# Patient Record
Sex: Female | Born: 1940 | Race: White | Hispanic: No
Health system: Southern US, Community
[De-identification: ages and names within clinical notes are randomized; demographics above are authoritative.]

---

## 1998-08-09 ENCOUNTER — Other Ambulatory Visit: Admission: RE | Admit: 1998-08-09 | Discharge: 1998-08-09 | Payer: Self-pay | Admitting: Obstetrics and Gynecology

## 1999-08-05 ENCOUNTER — Encounter: Payer: Self-pay | Admitting: Obstetrics and Gynecology

## 1999-08-05 ENCOUNTER — Encounter: Admission: RE | Admit: 1999-08-05 | Discharge: 1999-08-05 | Payer: Self-pay | Admitting: Obstetrics and Gynecology

## 2000-08-10 ENCOUNTER — Encounter: Admission: RE | Admit: 2000-08-10 | Discharge: 2000-08-10 | Payer: Self-pay | Admitting: Obstetrics and Gynecology

## 2000-08-10 ENCOUNTER — Encounter: Payer: Self-pay | Admitting: Obstetrics and Gynecology

## 2000-11-25 ENCOUNTER — Encounter (INDEPENDENT_AMBULATORY_CARE_PROVIDER_SITE_OTHER): Payer: Self-pay | Admitting: Specialist

## 2000-11-25 ENCOUNTER — Ambulatory Visit (HOSPITAL_COMMUNITY): Admission: RE | Admit: 2000-11-25 | Discharge: 2000-11-25 | Payer: Self-pay | Admitting: Gastroenterology

## 2001-08-23 ENCOUNTER — Encounter: Admission: RE | Admit: 2001-08-23 | Discharge: 2001-08-23 | Payer: Self-pay | Admitting: Obstetrics and Gynecology

## 2001-08-23 ENCOUNTER — Encounter: Payer: Self-pay | Admitting: Obstetrics and Gynecology

## 2002-09-13 ENCOUNTER — Encounter: Admission: RE | Admit: 2002-09-13 | Discharge: 2002-09-13 | Payer: Self-pay | Admitting: Obstetrics and Gynecology

## 2002-09-13 ENCOUNTER — Encounter: Payer: Self-pay | Admitting: Obstetrics and Gynecology

## 2003-10-11 ENCOUNTER — Ambulatory Visit (HOSPITAL_COMMUNITY): Admission: RE | Admit: 2003-10-11 | Discharge: 2003-10-11 | Payer: Self-pay | Admitting: Obstetrics and Gynecology

## 2003-10-24 ENCOUNTER — Other Ambulatory Visit: Admission: RE | Admit: 2003-10-24 | Discharge: 2003-10-24 | Payer: Self-pay | Admitting: Obstetrics and Gynecology

## 2004-11-11 ENCOUNTER — Ambulatory Visit (HOSPITAL_COMMUNITY): Admission: RE | Admit: 2004-11-11 | Discharge: 2004-11-11 | Payer: Self-pay | Admitting: Obstetrics and Gynecology

## 2004-11-20 ENCOUNTER — Other Ambulatory Visit: Admission: RE | Admit: 2004-11-20 | Discharge: 2004-11-20 | Payer: Self-pay | Admitting: Obstetrics and Gynecology

## 2004-12-05 ENCOUNTER — Encounter: Admission: RE | Admit: 2004-12-05 | Discharge: 2004-12-05 | Payer: Self-pay | Admitting: Obstetrics and Gynecology

## 2005-03-24 ENCOUNTER — Encounter: Admission: RE | Admit: 2005-03-24 | Discharge: 2005-03-24 | Payer: Self-pay | Admitting: General Surgery

## 2005-11-18 ENCOUNTER — Encounter
Admission: RE | Admit: 2005-11-18 | Discharge: 2005-11-18 | Payer: Self-pay | Admitting: Physical Medicine & Rehabilitation

## 2006-11-24 ENCOUNTER — Ambulatory Visit (HOSPITAL_COMMUNITY): Admission: RE | Admit: 2006-11-24 | Discharge: 2006-11-24 | Payer: Self-pay | Admitting: Obstetrics and Gynecology

## 2006-12-02 ENCOUNTER — Other Ambulatory Visit: Admission: RE | Admit: 2006-12-02 | Discharge: 2006-12-02 | Payer: Self-pay | Admitting: Obstetrics and Gynecology

## 2007-11-25 ENCOUNTER — Ambulatory Visit (HOSPITAL_COMMUNITY): Admission: RE | Admit: 2007-11-25 | Discharge: 2007-11-25 | Payer: Self-pay | Admitting: Obstetrics and Gynecology

## 2008-11-27 ENCOUNTER — Ambulatory Visit (HOSPITAL_COMMUNITY): Admission: RE | Admit: 2008-11-27 | Discharge: 2008-11-27 | Payer: Self-pay | Admitting: Obstetrics and Gynecology

## 2008-12-18 ENCOUNTER — Other Ambulatory Visit: Admission: RE | Admit: 2008-12-18 | Discharge: 2008-12-18 | Payer: Self-pay | Admitting: Obstetrics and Gynecology

## 2009-12-03 ENCOUNTER — Ambulatory Visit (HOSPITAL_COMMUNITY): Admission: RE | Admit: 2009-12-03 | Discharge: 2009-12-03 | Payer: Self-pay | Admitting: Obstetrics and Gynecology

## 2010-05-24 NOTE — Procedures (Signed)
[facility]  Patient:    Whitney Owen, Whitney Owen Visit Number: [id number] MRN: [id number]          Service Type: END Location: ENDO Attending Physician:  Orland Mustard Proc. Date: 11/25/00 Admit Date:  11/25/2000   CC:         Lafonda Mosses B. Thomasena Edis, M.D.  Arvella Merles, M.D.   Procedure Report  PROCEDURE PERFORMED:  Colonoscopy and coagulation of polyps.  ENDOSCOPIST:  Llana Aliment. Edwards, M.D.  MEDICATIONS:  Fentanyl 62.5 mcg and Versed 5 mg IV.  INDICATION:  Colon cancer screening.  DESCRIPTION OF PROCEDURE:  The procedure had been explained and patient consent obtained.  With the patient in the left lateral decubitus position, the Olympus pediatric video colonoscope was inserted and advanced under direct visualization.  The prep was excellent.  We were able to reach the cecum.  The ileocecal value and appendiceal orifice was identified.  The scope was withdrawn and the cecum, ascending colon, transverse colon, and descending colon were all unremarkable.  The sigmoid colon did reveal moderate diverticulosis.  In the distal sigmoid is a 3-4 mm sessile polyp was encountered and was cauterized.  No other polyps were seen.  The scope was withdrawn and the patient tolerated the procedure well.  ASSESSMENT: 1. A 3-4 mm sessile polyp in the distal sigmoid colon cauterized. 2. Moderate diverticulosis of the sigmoid colon.  PLAN:  Will check pathology, if adenomatous will recommend repeating   in three years, if hyperplastic routine follow up with family physician. Attending Physician:  Orland Mustard DD:  11/25/00 TD:  11/25/00 Job: [id number]

## 2010-11-07 ENCOUNTER — Other Ambulatory Visit (HOSPITAL_COMMUNITY): Payer: Self-pay | Admitting: Obstetrics and Gynecology

## 2010-11-07 DIAGNOSIS — Z1231 Encounter for screening mammogram for malignant neoplasm of breast: Secondary | ICD-10-CM

## 2010-12-11 ENCOUNTER — Ambulatory Visit (HOSPITAL_COMMUNITY)
Admission: RE | Admit: 2010-12-11 | Discharge: 2010-12-11 | Disposition: A | Discharge status: Home / Self Care | Payer: Medicare Other | Source: Ambulatory Visit | Attending: Obstetrics and Gynecology | Admitting: Obstetrics and Gynecology

## 2010-12-11 DIAGNOSIS — Z1231 Encounter for screening mammogram for malignant neoplasm of breast: Secondary | ICD-10-CM | POA: Insufficient documentation

## 2011-05-15 ENCOUNTER — Other Ambulatory Visit: Payer: Self-pay | Admitting: Dermatology

## 2011-05-15 DIAGNOSIS — D485 Neoplasm of uncertain behavior of skin: Secondary | ICD-10-CM | POA: Diagnosis not present

## 2011-05-15 DIAGNOSIS — L821 Other seborrheic keratosis: Secondary | ICD-10-CM | POA: Diagnosis not present

## 2011-05-15 DIAGNOSIS — L919 Hypertrophic disorder of the skin, unspecified: Secondary | ICD-10-CM | POA: Diagnosis not present

## 2011-05-15 DIAGNOSIS — I781 Nevus, non-neoplastic: Secondary | ICD-10-CM | POA: Diagnosis not present

## 2011-05-15 DIAGNOSIS — Z85828 Personal history of other malignant neoplasm of skin: Secondary | ICD-10-CM | POA: Diagnosis not present

## 2011-05-15 DIAGNOSIS — L909 Atrophic disorder of skin, unspecified: Secondary | ICD-10-CM | POA: Diagnosis not present

## 2011-07-16 DIAGNOSIS — H2510 Age-related nuclear cataract, unspecified eye: Secondary | ICD-10-CM | POA: Diagnosis not present

## 2011-07-17 DIAGNOSIS — M899 Disorder of bone, unspecified: Secondary | ICD-10-CM | POA: Diagnosis not present

## 2011-07-17 DIAGNOSIS — M949 Disorder of cartilage, unspecified: Secondary | ICD-10-CM | POA: Diagnosis not present

## 2011-10-31 DIAGNOSIS — M949 Disorder of cartilage, unspecified: Secondary | ICD-10-CM | POA: Diagnosis not present

## 2011-10-31 DIAGNOSIS — Z0000 Encounter for general adult medical examination without abnormal findings: Secondary | ICD-10-CM | POA: Diagnosis not present

## 2011-10-31 DIAGNOSIS — E039 Hypothyroidism, unspecified: Secondary | ICD-10-CM | POA: Diagnosis not present

## 2011-10-31 DIAGNOSIS — M25519 Pain in unspecified shoulder: Secondary | ICD-10-CM | POA: Diagnosis not present

## 2011-10-31 DIAGNOSIS — E785 Hyperlipidemia, unspecified: Secondary | ICD-10-CM | POA: Diagnosis not present

## 2011-10-31 DIAGNOSIS — Z23 Encounter for immunization: Secondary | ICD-10-CM | POA: Diagnosis not present

## 2011-10-31 DIAGNOSIS — M899 Disorder of bone, unspecified: Secondary | ICD-10-CM | POA: Diagnosis not present

## 2011-11-13 ENCOUNTER — Other Ambulatory Visit: Payer: Self-pay | Admitting: Obstetrics and Gynecology

## 2011-11-13 ENCOUNTER — Other Ambulatory Visit (HOSPITAL_COMMUNITY): Payer: Self-pay | Admitting: Obstetrics and Gynecology

## 2011-11-13 DIAGNOSIS — Z1231 Encounter for screening mammogram for malignant neoplasm of breast: Secondary | ICD-10-CM

## 2011-12-19 DIAGNOSIS — M67919 Unspecified disorder of synovium and tendon, unspecified shoulder: Secondary | ICD-10-CM | POA: Diagnosis not present

## 2011-12-19 DIAGNOSIS — M719 Bursopathy, unspecified: Secondary | ICD-10-CM | POA: Diagnosis not present

## 2011-12-26 ENCOUNTER — Ambulatory Visit
Admission: RE | Admit: 2011-12-26 | Discharge: 2011-12-26 | Disposition: A | Discharge status: Home / Self Care | Payer: PRIVATE HEALTH INSURANCE | Source: Ambulatory Visit | Attending: Obstetrics and Gynecology | Admitting: Obstetrics and Gynecology

## 2011-12-26 DIAGNOSIS — Z1231 Encounter for screening mammogram for malignant neoplasm of breast: Secondary | ICD-10-CM

## 2012-01-01 ENCOUNTER — Other Ambulatory Visit (HOSPITAL_COMMUNITY)
Admission: RE | Admit: 2012-01-01 | Discharge: 2012-01-01 | Disposition: A | Discharge status: Home / Self Care | Payer: Medicare Other | Source: Ambulatory Visit | Attending: Obstetrics and Gynecology | Admitting: Obstetrics and Gynecology

## 2012-01-01 ENCOUNTER — Other Ambulatory Visit: Payer: Self-pay | Admitting: Obstetrics and Gynecology

## 2012-01-01 DIAGNOSIS — Z124 Encounter for screening for malignant neoplasm of cervix: Secondary | ICD-10-CM | POA: Diagnosis not present

## 2012-01-01 DIAGNOSIS — M899 Disorder of bone, unspecified: Secondary | ICD-10-CM | POA: Diagnosis not present

## 2012-01-01 DIAGNOSIS — Z01419 Encounter for gynecological examination (general) (routine) without abnormal findings: Secondary | ICD-10-CM | POA: Diagnosis not present

## 2012-01-01 DIAGNOSIS — M949 Disorder of cartilage, unspecified: Secondary | ICD-10-CM | POA: Diagnosis not present

## 2012-01-09 DIAGNOSIS — M67919 Unspecified disorder of synovium and tendon, unspecified shoulder: Secondary | ICD-10-CM | POA: Diagnosis not present

## 2012-01-09 DIAGNOSIS — M719 Bursopathy, unspecified: Secondary | ICD-10-CM | POA: Diagnosis not present

## 2012-04-12 DIAGNOSIS — M5412 Radiculopathy, cervical region: Secondary | ICD-10-CM | POA: Diagnosis not present

## 2012-04-12 DIAGNOSIS — M9981 Other biomechanical lesions of cervical region: Secondary | ICD-10-CM | POA: Diagnosis not present

## 2012-04-15 DIAGNOSIS — M9981 Other biomechanical lesions of cervical region: Secondary | ICD-10-CM | POA: Diagnosis not present

## 2012-04-15 DIAGNOSIS — M5412 Radiculopathy, cervical region: Secondary | ICD-10-CM | POA: Diagnosis not present

## 2012-04-26 DIAGNOSIS — M9981 Other biomechanical lesions of cervical region: Secondary | ICD-10-CM | POA: Diagnosis not present

## 2012-04-26 DIAGNOSIS — M5412 Radiculopathy, cervical region: Secondary | ICD-10-CM | POA: Diagnosis not present

## 2012-04-29 DIAGNOSIS — M9981 Other biomechanical lesions of cervical region: Secondary | ICD-10-CM | POA: Diagnosis not present

## 2012-04-29 DIAGNOSIS — M5412 Radiculopathy, cervical region: Secondary | ICD-10-CM | POA: Diagnosis not present

## 2012-05-05 DIAGNOSIS — M9981 Other biomechanical lesions of cervical region: Secondary | ICD-10-CM | POA: Diagnosis not present

## 2012-05-05 DIAGNOSIS — M5412 Radiculopathy, cervical region: Secondary | ICD-10-CM | POA: Diagnosis not present

## 2012-05-12 DIAGNOSIS — M9981 Other biomechanical lesions of cervical region: Secondary | ICD-10-CM | POA: Diagnosis not present

## 2012-05-12 DIAGNOSIS — M5412 Radiculopathy, cervical region: Secondary | ICD-10-CM | POA: Diagnosis not present

## 2012-05-19 DIAGNOSIS — M5412 Radiculopathy, cervical region: Secondary | ICD-10-CM | POA: Diagnosis not present

## 2012-05-19 DIAGNOSIS — M9981 Other biomechanical lesions of cervical region: Secondary | ICD-10-CM | POA: Diagnosis not present

## 2012-05-26 DIAGNOSIS — M5412 Radiculopathy, cervical region: Secondary | ICD-10-CM | POA: Diagnosis not present

## 2012-05-26 DIAGNOSIS — M9981 Other biomechanical lesions of cervical region: Secondary | ICD-10-CM | POA: Diagnosis not present

## 2012-05-27 DIAGNOSIS — L219 Seborrheic dermatitis, unspecified: Secondary | ICD-10-CM | POA: Diagnosis not present

## 2012-05-27 DIAGNOSIS — Z85828 Personal history of other malignant neoplasm of skin: Secondary | ICD-10-CM | POA: Diagnosis not present

## 2012-05-27 DIAGNOSIS — D239 Other benign neoplasm of skin, unspecified: Secondary | ICD-10-CM | POA: Diagnosis not present

## 2012-05-27 DIAGNOSIS — L821 Other seborrheic keratosis: Secondary | ICD-10-CM | POA: Diagnosis not present

## 2012-08-12 DIAGNOSIS — H2510 Age-related nuclear cataract, unspecified eye: Secondary | ICD-10-CM | POA: Diagnosis not present

## 2012-11-01 DIAGNOSIS — M899 Disorder of bone, unspecified: Secondary | ICD-10-CM | POA: Diagnosis not present

## 2012-11-01 DIAGNOSIS — E039 Hypothyroidism, unspecified: Secondary | ICD-10-CM | POA: Diagnosis not present

## 2012-11-01 DIAGNOSIS — E785 Hyperlipidemia, unspecified: Secondary | ICD-10-CM | POA: Diagnosis not present

## 2012-11-01 DIAGNOSIS — Z0000 Encounter for general adult medical examination without abnormal findings: Secondary | ICD-10-CM | POA: Diagnosis not present

## 2012-11-01 DIAGNOSIS — Z23 Encounter for immunization: Secondary | ICD-10-CM | POA: Diagnosis not present

## 2012-11-22 ENCOUNTER — Other Ambulatory Visit: Payer: Self-pay

## 2012-11-22 DIAGNOSIS — Z1231 Encounter for screening mammogram for malignant neoplasm of breast: Secondary | ICD-10-CM

## 2012-12-28 ENCOUNTER — Ambulatory Visit
Admission: RE | Admit: 2012-12-28 | Discharge: 2012-12-28 | Disposition: A | Discharge status: Home / Self Care | Payer: Medicare Other | Source: Ambulatory Visit

## 2012-12-28 DIAGNOSIS — Z1231 Encounter for screening mammogram for malignant neoplasm of breast: Secondary | ICD-10-CM

## 2013-01-13 DIAGNOSIS — M949 Disorder of cartilage, unspecified: Secondary | ICD-10-CM | POA: Diagnosis not present

## 2013-01-13 DIAGNOSIS — Z01419 Encounter for gynecological examination (general) (routine) without abnormal findings: Secondary | ICD-10-CM | POA: Diagnosis not present

## 2013-01-13 DIAGNOSIS — M899 Disorder of bone, unspecified: Secondary | ICD-10-CM | POA: Diagnosis not present

## 2013-04-20 DIAGNOSIS — E039 Hypothyroidism, unspecified: Secondary | ICD-10-CM | POA: Diagnosis not present

## 2013-07-06 DIAGNOSIS — D2370 Other benign neoplasm of skin of unspecified lower limb, including hip: Secondary | ICD-10-CM | POA: Diagnosis not present

## 2013-07-06 DIAGNOSIS — I781 Nevus, non-neoplastic: Secondary | ICD-10-CM | POA: Diagnosis not present

## 2013-07-06 DIAGNOSIS — L821 Other seborrheic keratosis: Secondary | ICD-10-CM | POA: Diagnosis not present

## 2013-07-06 DIAGNOSIS — D239 Other benign neoplasm of skin, unspecified: Secondary | ICD-10-CM | POA: Diagnosis not present

## 2013-07-06 DIAGNOSIS — Z85828 Personal history of other malignant neoplasm of skin: Secondary | ICD-10-CM | POA: Diagnosis not present

## 2013-07-06 DIAGNOSIS — L219 Seborrheic dermatitis, unspecified: Secondary | ICD-10-CM | POA: Diagnosis not present

## 2013-07-20 DIAGNOSIS — M949 Disorder of cartilage, unspecified: Secondary | ICD-10-CM | POA: Diagnosis not present

## 2013-07-20 DIAGNOSIS — M899 Disorder of bone, unspecified: Secondary | ICD-10-CM | POA: Diagnosis not present

## 2013-11-02 DIAGNOSIS — L989 Disorder of the skin and subcutaneous tissue, unspecified: Secondary | ICD-10-CM | POA: Diagnosis not present

## 2013-11-02 DIAGNOSIS — Z1211 Encounter for screening for malignant neoplasm of colon: Secondary | ICD-10-CM | POA: Diagnosis not present

## 2013-11-02 DIAGNOSIS — Z0000 Encounter for general adult medical examination without abnormal findings: Secondary | ICD-10-CM | POA: Diagnosis not present

## 2013-11-02 DIAGNOSIS — M899 Disorder of bone, unspecified: Secondary | ICD-10-CM | POA: Diagnosis not present

## 2013-11-02 DIAGNOSIS — E039 Hypothyroidism, unspecified: Secondary | ICD-10-CM | POA: Diagnosis not present

## 2013-11-02 DIAGNOSIS — E780 Pure hypercholesterolemia: Secondary | ICD-10-CM | POA: Diagnosis not present

## 2013-11-02 DIAGNOSIS — I341 Nonrheumatic mitral (valve) prolapse: Secondary | ICD-10-CM | POA: Diagnosis not present

## 2013-11-02 DIAGNOSIS — Z23 Encounter for immunization: Secondary | ICD-10-CM | POA: Diagnosis not present

## 2013-11-03 ENCOUNTER — Other Ambulatory Visit: Payer: Self-pay | Admitting: Dermatology

## 2013-11-03 DIAGNOSIS — C44311 Basal cell carcinoma of skin of nose: Secondary | ICD-10-CM | POA: Diagnosis not present

## 2013-11-03 DIAGNOSIS — D485 Neoplasm of uncertain behavior of skin: Secondary | ICD-10-CM | POA: Diagnosis not present

## 2013-11-08 DIAGNOSIS — H2513 Age-related nuclear cataract, bilateral: Secondary | ICD-10-CM | POA: Diagnosis not present

## 2013-11-15 DIAGNOSIS — Z1211 Encounter for screening for malignant neoplasm of colon: Secondary | ICD-10-CM | POA: Diagnosis not present

## 2013-12-06 ENCOUNTER — Other Ambulatory Visit: Payer: Self-pay

## 2013-12-06 DIAGNOSIS — Z1231 Encounter for screening mammogram for malignant neoplasm of breast: Secondary | ICD-10-CM

## 2013-12-29 ENCOUNTER — Ambulatory Visit
Admission: RE | Admit: 2013-12-29 | Discharge: 2013-12-29 | Disposition: A | Discharge status: Home / Self Care | Payer: Medicare Other | Source: Ambulatory Visit

## 2013-12-29 DIAGNOSIS — Z1231 Encounter for screening mammogram for malignant neoplasm of breast: Secondary | ICD-10-CM

## 2014-02-09 DIAGNOSIS — M5432 Sciatica, left side: Secondary | ICD-10-CM | POA: Diagnosis not present

## 2014-02-09 DIAGNOSIS — M9903 Segmental and somatic dysfunction of lumbar region: Secondary | ICD-10-CM | POA: Diagnosis not present

## 2014-02-13 DIAGNOSIS — C44311 Basal cell carcinoma of skin of nose: Secondary | ICD-10-CM | POA: Diagnosis not present

## 2014-04-25 DIAGNOSIS — E039 Hypothyroidism, unspecified: Secondary | ICD-10-CM | POA: Diagnosis not present

## 2014-08-09 DIAGNOSIS — L821 Other seborrheic keratosis: Secondary | ICD-10-CM | POA: Diagnosis not present

## 2014-08-09 DIAGNOSIS — D2230 Melanocytic nevi of unspecified part of face: Secondary | ICD-10-CM | POA: Diagnosis not present

## 2014-08-09 DIAGNOSIS — Q828 Other specified congenital malformations of skin: Secondary | ICD-10-CM | POA: Diagnosis not present

## 2014-08-09 DIAGNOSIS — D2371 Other benign neoplasm of skin of right lower limb, including hip: Secondary | ICD-10-CM | POA: Diagnosis not present

## 2014-08-09 DIAGNOSIS — Z85828 Personal history of other malignant neoplasm of skin: Secondary | ICD-10-CM | POA: Diagnosis not present

## 2014-11-06 DIAGNOSIS — E039 Hypothyroidism, unspecified: Secondary | ICD-10-CM | POA: Diagnosis not present

## 2014-11-06 DIAGNOSIS — Z23 Encounter for immunization: Secondary | ICD-10-CM | POA: Diagnosis not present

## 2014-11-06 DIAGNOSIS — M899 Disorder of bone, unspecified: Secondary | ICD-10-CM | POA: Diagnosis not present

## 2014-11-06 DIAGNOSIS — Z1211 Encounter for screening for malignant neoplasm of colon: Secondary | ICD-10-CM | POA: Diagnosis not present

## 2014-11-06 DIAGNOSIS — Z0000 Encounter for general adult medical examination without abnormal findings: Secondary | ICD-10-CM | POA: Diagnosis not present

## 2014-11-06 DIAGNOSIS — E786 Lipoprotein deficiency: Secondary | ICD-10-CM | POA: Diagnosis not present

## 2014-11-20 DIAGNOSIS — Z1211 Encounter for screening for malignant neoplasm of colon: Secondary | ICD-10-CM | POA: Diagnosis not present

## 2014-12-18 ENCOUNTER — Other Ambulatory Visit: Payer: Self-pay

## 2014-12-18 DIAGNOSIS — Z1231 Encounter for screening mammogram for malignant neoplasm of breast: Secondary | ICD-10-CM

## 2015-01-18 ENCOUNTER — Ambulatory Visit
Admission: RE | Admit: 2015-01-18 | Discharge: 2015-01-18 | Disposition: A | Discharge status: Home / Self Care | Payer: Medicare Other | Source: Ambulatory Visit

## 2015-01-18 DIAGNOSIS — Z1231 Encounter for screening mammogram for malignant neoplasm of breast: Secondary | ICD-10-CM | POA: Diagnosis not present

## 2015-01-24 DIAGNOSIS — N952 Postmenopausal atrophic vaginitis: Secondary | ICD-10-CM | POA: Diagnosis not present

## 2015-01-24 DIAGNOSIS — N762 Acute vulvitis: Secondary | ICD-10-CM | POA: Diagnosis not present

## 2015-01-24 DIAGNOSIS — Z01411 Encounter for gynecological examination (general) (routine) with abnormal findings: Secondary | ICD-10-CM | POA: Diagnosis not present

## 2015-01-24 DIAGNOSIS — M8580 Other specified disorders of bone density and structure, unspecified site: Secondary | ICD-10-CM | POA: Diagnosis not present

## 2015-02-02 DIAGNOSIS — H2513 Age-related nuclear cataract, bilateral: Secondary | ICD-10-CM | POA: Diagnosis not present

## 2015-05-21 DIAGNOSIS — R030 Elevated blood-pressure reading, without diagnosis of hypertension: Secondary | ICD-10-CM | POA: Diagnosis not present

## 2015-05-21 DIAGNOSIS — J00 Acute nasopharyngitis [common cold]: Secondary | ICD-10-CM | POA: Diagnosis not present

## 2015-05-21 DIAGNOSIS — H66002 Acute suppurative otitis media without spontaneous rupture of ear drum, left ear: Secondary | ICD-10-CM | POA: Diagnosis not present

## 2015-07-23 DIAGNOSIS — M8588 Other specified disorders of bone density and structure, other site: Secondary | ICD-10-CM | POA: Diagnosis not present

## 2015-07-23 DIAGNOSIS — M810 Age-related osteoporosis without current pathological fracture: Secondary | ICD-10-CM | POA: Diagnosis not present

## 2015-09-06 DIAGNOSIS — D2230 Melanocytic nevi of unspecified part of face: Secondary | ICD-10-CM | POA: Diagnosis not present

## 2015-09-06 DIAGNOSIS — Z85828 Personal history of other malignant neoplasm of skin: Secondary | ICD-10-CM | POA: Diagnosis not present

## 2015-09-06 DIAGNOSIS — D1800 Hemangioma unspecified site: Secondary | ICD-10-CM | POA: Diagnosis not present

## 2015-09-06 DIAGNOSIS — D2371 Other benign neoplasm of skin of right lower limb, including hip: Secondary | ICD-10-CM | POA: Diagnosis not present

## 2015-09-06 DIAGNOSIS — Q828 Other specified congenital malformations of skin: Secondary | ICD-10-CM | POA: Diagnosis not present

## 2015-09-06 DIAGNOSIS — L821 Other seborrheic keratosis: Secondary | ICD-10-CM | POA: Diagnosis not present

## 2015-11-07 DIAGNOSIS — E7800 Pure hypercholesterolemia, unspecified: Secondary | ICD-10-CM | POA: Diagnosis not present

## 2015-11-07 DIAGNOSIS — Z0000 Encounter for general adult medical examination without abnormal findings: Secondary | ICD-10-CM | POA: Diagnosis not present

## 2015-11-07 DIAGNOSIS — Z23 Encounter for immunization: Secondary | ICD-10-CM | POA: Diagnosis not present

## 2015-11-07 DIAGNOSIS — M859 Disorder of bone density and structure, unspecified: Secondary | ICD-10-CM | POA: Diagnosis not present

## 2015-11-07 DIAGNOSIS — Z1211 Encounter for screening for malignant neoplasm of colon: Secondary | ICD-10-CM | POA: Diagnosis not present

## 2015-11-07 DIAGNOSIS — E039 Hypothyroidism, unspecified: Secondary | ICD-10-CM | POA: Diagnosis not present

## 2015-11-07 DIAGNOSIS — K219 Gastro-esophageal reflux disease without esophagitis: Secondary | ICD-10-CM | POA: Diagnosis not present

## 2015-11-15 DIAGNOSIS — Z1211 Encounter for screening for malignant neoplasm of colon: Secondary | ICD-10-CM | POA: Diagnosis not present

## 2015-12-24 ENCOUNTER — Other Ambulatory Visit: Payer: Self-pay | Admitting: Obstetrics and Gynecology

## 2015-12-24 DIAGNOSIS — Z1231 Encounter for screening mammogram for malignant neoplasm of breast: Secondary | ICD-10-CM

## 2016-01-22 ENCOUNTER — Ambulatory Visit
Admission: RE | Admit: 2016-01-22 | Discharge: 2016-01-22 | Disposition: A | Discharge status: Home / Self Care | Payer: Medicare Other | Source: Ambulatory Visit | Attending: Obstetrics and Gynecology | Admitting: Obstetrics and Gynecology

## 2016-01-22 DIAGNOSIS — Z1231 Encounter for screening mammogram for malignant neoplasm of breast: Secondary | ICD-10-CM | POA: Diagnosis not present

## 2016-04-23 DIAGNOSIS — M810 Age-related osteoporosis without current pathological fracture: Secondary | ICD-10-CM | POA: Diagnosis not present

## 2016-04-23 DIAGNOSIS — N7689 Other specified inflammation of vagina and vulva: Secondary | ICD-10-CM | POA: Diagnosis not present

## 2016-05-05 DIAGNOSIS — H2513 Age-related nuclear cataract, bilateral: Secondary | ICD-10-CM | POA: Diagnosis not present

## 2016-05-05 DIAGNOSIS — H25013 Cortical age-related cataract, bilateral: Secondary | ICD-10-CM | POA: Diagnosis not present

## 2016-05-05 DIAGNOSIS — H25043 Posterior subcapsular polar age-related cataract, bilateral: Secondary | ICD-10-CM | POA: Diagnosis not present

## 2016-05-05 DIAGNOSIS — H524 Presbyopia: Secondary | ICD-10-CM | POA: Diagnosis not present

## 2016-06-12 DIAGNOSIS — H25811 Combined forms of age-related cataract, right eye: Secondary | ICD-10-CM | POA: Diagnosis not present

## 2016-06-12 DIAGNOSIS — H25041 Posterior subcapsular polar age-related cataract, right eye: Secondary | ICD-10-CM | POA: Diagnosis not present

## 2016-06-12 DIAGNOSIS — H2511 Age-related nuclear cataract, right eye: Secondary | ICD-10-CM | POA: Diagnosis not present

## 2016-07-24 DIAGNOSIS — H25812 Combined forms of age-related cataract, left eye: Secondary | ICD-10-CM | POA: Diagnosis not present

## 2016-07-24 DIAGNOSIS — H2512 Age-related nuclear cataract, left eye: Secondary | ICD-10-CM | POA: Diagnosis not present

## 2016-07-24 DIAGNOSIS — H25012 Cortical age-related cataract, left eye: Secondary | ICD-10-CM | POA: Diagnosis not present

## 2016-07-24 DIAGNOSIS — H25042 Posterior subcapsular polar age-related cataract, left eye: Secondary | ICD-10-CM | POA: Diagnosis not present

## 2016-09-04 DIAGNOSIS — E039 Hypothyroidism, unspecified: Secondary | ICD-10-CM | POA: Diagnosis not present

## 2016-09-18 DIAGNOSIS — L219 Seborrheic dermatitis, unspecified: Secondary | ICD-10-CM | POA: Diagnosis not present

## 2016-09-18 DIAGNOSIS — Q828 Other specified congenital malformations of skin: Secondary | ICD-10-CM | POA: Diagnosis not present

## 2016-09-18 DIAGNOSIS — D485 Neoplasm of uncertain behavior of skin: Secondary | ICD-10-CM | POA: Diagnosis not present

## 2016-09-18 DIAGNOSIS — D1800 Hemangioma unspecified site: Secondary | ICD-10-CM | POA: Diagnosis not present

## 2016-09-18 DIAGNOSIS — L821 Other seborrheic keratosis: Secondary | ICD-10-CM | POA: Diagnosis not present

## 2016-09-18 DIAGNOSIS — D2371 Other benign neoplasm of skin of right lower limb, including hip: Secondary | ICD-10-CM | POA: Diagnosis not present

## 2016-09-18 DIAGNOSIS — Z85828 Personal history of other malignant neoplasm of skin: Secondary | ICD-10-CM | POA: Diagnosis not present

## 2016-09-18 DIAGNOSIS — Z23 Encounter for immunization: Secondary | ICD-10-CM | POA: Diagnosis not present

## 2016-09-18 DIAGNOSIS — D2230 Melanocytic nevi of unspecified part of face: Secondary | ICD-10-CM | POA: Diagnosis not present

## 2016-11-13 DIAGNOSIS — Z23 Encounter for immunization: Secondary | ICD-10-CM | POA: Diagnosis not present

## 2017-01-23 DIAGNOSIS — Z0000 Encounter for general adult medical examination without abnormal findings: Secondary | ICD-10-CM | POA: Diagnosis not present

## 2017-01-23 DIAGNOSIS — E039 Hypothyroidism, unspecified: Secondary | ICD-10-CM | POA: Diagnosis not present

## 2017-01-23 DIAGNOSIS — R1033 Periumbilical pain: Secondary | ICD-10-CM | POA: Diagnosis not present

## 2017-01-23 DIAGNOSIS — K219 Gastro-esophageal reflux disease without esophagitis: Secondary | ICD-10-CM | POA: Diagnosis not present

## 2017-01-23 DIAGNOSIS — Z1211 Encounter for screening for malignant neoplasm of colon: Secondary | ICD-10-CM | POA: Diagnosis not present

## 2017-01-23 DIAGNOSIS — R252 Cramp and spasm: Secondary | ICD-10-CM | POA: Diagnosis not present

## 2017-01-23 DIAGNOSIS — M810 Age-related osteoporosis without current pathological fracture: Secondary | ICD-10-CM | POA: Diagnosis not present

## 2017-01-23 DIAGNOSIS — I341 Nonrheumatic mitral (valve) prolapse: Secondary | ICD-10-CM | POA: Diagnosis not present

## 2017-01-23 DIAGNOSIS — E7800 Pure hypercholesterolemia, unspecified: Secondary | ICD-10-CM | POA: Diagnosis not present

## 2017-01-30 ENCOUNTER — Other Ambulatory Visit: Payer: Self-pay | Admitting: Obstetrics and Gynecology

## 2017-01-30 DIAGNOSIS — Z139 Encounter for screening, unspecified: Secondary | ICD-10-CM

## 2017-02-18 ENCOUNTER — Ambulatory Visit
Admission: RE | Admit: 2017-02-18 | Discharge: 2017-02-18 | Disposition: A | Discharge status: Home / Self Care | Payer: Medicare Other | Source: Ambulatory Visit | Attending: Obstetrics and Gynecology | Admitting: Obstetrics and Gynecology

## 2017-02-18 DIAGNOSIS — Z139 Encounter for screening, unspecified: Secondary | ICD-10-CM

## 2017-02-18 DIAGNOSIS — Z1231 Encounter for screening mammogram for malignant neoplasm of breast: Secondary | ICD-10-CM | POA: Diagnosis not present

## 2017-05-13 DIAGNOSIS — N763 Subacute and chronic vulvitis: Secondary | ICD-10-CM | POA: Diagnosis not present

## 2017-05-13 DIAGNOSIS — M810 Age-related osteoporosis without current pathological fracture: Secondary | ICD-10-CM | POA: Diagnosis not present

## 2017-05-13 DIAGNOSIS — N952 Postmenopausal atrophic vaginitis: Secondary | ICD-10-CM | POA: Diagnosis not present

## 2017-05-13 DIAGNOSIS — Z01419 Encounter for gynecological examination (general) (routine) without abnormal findings: Secondary | ICD-10-CM | POA: Diagnosis not present

## 2017-08-06 DIAGNOSIS — H35371 Puckering of macula, right eye: Secondary | ICD-10-CM | POA: Diagnosis not present

## 2017-08-06 DIAGNOSIS — H43813 Vitreous degeneration, bilateral: Secondary | ICD-10-CM | POA: Diagnosis not present

## 2017-08-19 DIAGNOSIS — M8588 Other specified disorders of bone density and structure, other site: Secondary | ICD-10-CM | POA: Diagnosis not present

## 2017-08-19 DIAGNOSIS — M810 Age-related osteoporosis without current pathological fracture: Secondary | ICD-10-CM | POA: Diagnosis not present

## 2017-09-03 DIAGNOSIS — H43813 Vitreous degeneration, bilateral: Secondary | ICD-10-CM | POA: Diagnosis not present

## 2017-09-03 DIAGNOSIS — H35341 Macular cyst, hole, or pseudohole, right eye: Secondary | ICD-10-CM | POA: Diagnosis not present

## 2017-09-03 DIAGNOSIS — H35363 Drusen (degenerative) of macula, bilateral: Secondary | ICD-10-CM | POA: Diagnosis not present

## 2017-09-03 DIAGNOSIS — H35371 Puckering of macula, right eye: Secondary | ICD-10-CM | POA: Diagnosis not present

## 2017-09-30 DIAGNOSIS — L219 Seborrheic dermatitis, unspecified: Secondary | ICD-10-CM | POA: Diagnosis not present

## 2017-09-30 DIAGNOSIS — D2230 Melanocytic nevi of unspecified part of face: Secondary | ICD-10-CM | POA: Diagnosis not present

## 2017-09-30 DIAGNOSIS — Q828 Other specified congenital malformations of skin: Secondary | ICD-10-CM | POA: Diagnosis not present

## 2017-09-30 DIAGNOSIS — D1800 Hemangioma unspecified site: Secondary | ICD-10-CM | POA: Diagnosis not present

## 2017-09-30 DIAGNOSIS — L570 Actinic keratosis: Secondary | ICD-10-CM | POA: Diagnosis not present

## 2017-09-30 DIAGNOSIS — L821 Other seborrheic keratosis: Secondary | ICD-10-CM | POA: Diagnosis not present

## 2017-09-30 DIAGNOSIS — D2371 Other benign neoplasm of skin of right lower limb, including hip: Secondary | ICD-10-CM | POA: Diagnosis not present

## 2017-09-30 DIAGNOSIS — Z85828 Personal history of other malignant neoplasm of skin: Secondary | ICD-10-CM | POA: Diagnosis not present

## 2017-10-23 DIAGNOSIS — H35371 Puckering of macula, right eye: Secondary | ICD-10-CM | POA: Diagnosis not present

## 2017-10-23 DIAGNOSIS — H35341 Macular cyst, hole, or pseudohole, right eye: Secondary | ICD-10-CM | POA: Diagnosis not present

## 2017-11-16 DIAGNOSIS — H43812 Vitreous degeneration, left eye: Secondary | ICD-10-CM | POA: Diagnosis not present

## 2017-11-16 DIAGNOSIS — H35341 Macular cyst, hole, or pseudohole, right eye: Secondary | ICD-10-CM | POA: Diagnosis not present

## 2017-11-17 DIAGNOSIS — Z23 Encounter for immunization: Secondary | ICD-10-CM | POA: Diagnosis not present

## 2018-01-29 ENCOUNTER — Other Ambulatory Visit: Payer: Self-pay | Admitting: Obstetrics and Gynecology

## 2018-01-29 DIAGNOSIS — Z1231 Encounter for screening mammogram for malignant neoplasm of breast: Secondary | ICD-10-CM

## 2018-02-10 DIAGNOSIS — K219 Gastro-esophageal reflux disease without esophagitis: Secondary | ICD-10-CM | POA: Diagnosis not present

## 2018-02-10 DIAGNOSIS — E7800 Pure hypercholesterolemia, unspecified: Secondary | ICD-10-CM | POA: Diagnosis not present

## 2018-02-10 DIAGNOSIS — Z1211 Encounter for screening for malignant neoplasm of colon: Secondary | ICD-10-CM | POA: Diagnosis not present

## 2018-02-10 DIAGNOSIS — J0100 Acute maxillary sinusitis, unspecified: Secondary | ICD-10-CM | POA: Diagnosis not present

## 2018-02-10 DIAGNOSIS — E039 Hypothyroidism, unspecified: Secondary | ICD-10-CM | POA: Diagnosis not present

## 2018-02-10 DIAGNOSIS — Z0000 Encounter for general adult medical examination without abnormal findings: Secondary | ICD-10-CM | POA: Diagnosis not present

## 2018-03-03 ENCOUNTER — Ambulatory Visit
Admission: RE | Admit: 2018-03-03 | Discharge: 2018-03-03 | Disposition: A | Discharge status: Home / Self Care | Payer: Medicare Other | Source: Ambulatory Visit | Attending: Obstetrics and Gynecology | Admitting: Obstetrics and Gynecology

## 2018-03-03 DIAGNOSIS — Z1231 Encounter for screening mammogram for malignant neoplasm of breast: Secondary | ICD-10-CM | POA: Diagnosis not present

## 2018-09-15 DIAGNOSIS — D2261 Melanocytic nevi of right upper limb, including shoulder: Secondary | ICD-10-CM | POA: Diagnosis not present

## 2018-09-15 DIAGNOSIS — D2230 Melanocytic nevi of unspecified part of face: Secondary | ICD-10-CM | POA: Diagnosis not present

## 2018-09-15 DIAGNOSIS — D2371 Other benign neoplasm of skin of right lower limb, including hip: Secondary | ICD-10-CM | POA: Diagnosis not present

## 2018-09-15 DIAGNOSIS — L219 Seborrheic dermatitis, unspecified: Secondary | ICD-10-CM | POA: Diagnosis not present

## 2018-09-15 DIAGNOSIS — Z85828 Personal history of other malignant neoplasm of skin: Secondary | ICD-10-CM | POA: Diagnosis not present

## 2018-09-15 DIAGNOSIS — D485 Neoplasm of uncertain behavior of skin: Secondary | ICD-10-CM | POA: Diagnosis not present

## 2018-09-15 DIAGNOSIS — Q828 Other specified congenital malformations of skin: Secondary | ICD-10-CM | POA: Diagnosis not present

## 2018-09-15 DIAGNOSIS — D1800 Hemangioma unspecified site: Secondary | ICD-10-CM | POA: Diagnosis not present

## 2018-10-19 DIAGNOSIS — H43812 Vitreous degeneration, left eye: Secondary | ICD-10-CM | POA: Diagnosis not present

## 2018-10-19 DIAGNOSIS — H52203 Unspecified astigmatism, bilateral: Secondary | ICD-10-CM | POA: Diagnosis not present

## 2018-10-19 DIAGNOSIS — H04123 Dry eye syndrome of bilateral lacrimal glands: Secondary | ICD-10-CM | POA: Diagnosis not present

## 2018-10-19 DIAGNOSIS — H0100A Unspecified blepharitis right eye, upper and lower eyelids: Secondary | ICD-10-CM | POA: Diagnosis not present

## 2018-11-02 DIAGNOSIS — Z23 Encounter for immunization: Secondary | ICD-10-CM | POA: Diagnosis not present

## 2019-01-12 DIAGNOSIS — M25512 Pain in left shoulder: Secondary | ICD-10-CM | POA: Diagnosis not present

## 2019-02-09 DIAGNOSIS — S46012D Strain of muscle(s) and tendon(s) of the rotator cuff of left shoulder, subsequent encounter: Secondary | ICD-10-CM | POA: Diagnosis not present

## 2019-02-09 DIAGNOSIS — M7542 Impingement syndrome of left shoulder: Secondary | ICD-10-CM | POA: Diagnosis not present

## 2019-02-14 DIAGNOSIS — S46012D Strain of muscle(s) and tendon(s) of the rotator cuff of left shoulder, subsequent encounter: Secondary | ICD-10-CM | POA: Diagnosis not present

## 2019-02-14 DIAGNOSIS — M7542 Impingement syndrome of left shoulder: Secondary | ICD-10-CM | POA: Diagnosis not present

## 2019-02-16 DIAGNOSIS — M7542 Impingement syndrome of left shoulder: Secondary | ICD-10-CM | POA: Diagnosis not present

## 2019-02-16 DIAGNOSIS — S46012D Strain of muscle(s) and tendon(s) of the rotator cuff of left shoulder, subsequent encounter: Secondary | ICD-10-CM | POA: Diagnosis not present

## 2019-02-21 DIAGNOSIS — S46012D Strain of muscle(s) and tendon(s) of the rotator cuff of left shoulder, subsequent encounter: Secondary | ICD-10-CM | POA: Diagnosis not present

## 2019-02-21 DIAGNOSIS — M7542 Impingement syndrome of left shoulder: Secondary | ICD-10-CM | POA: Diagnosis not present

## 2019-02-23 DIAGNOSIS — E039 Hypothyroidism, unspecified: Secondary | ICD-10-CM | POA: Diagnosis not present

## 2019-02-23 DIAGNOSIS — Z1211 Encounter for screening for malignant neoplasm of colon: Secondary | ICD-10-CM | POA: Diagnosis not present

## 2019-02-23 DIAGNOSIS — Z0000 Encounter for general adult medical examination without abnormal findings: Secondary | ICD-10-CM | POA: Diagnosis not present

## 2019-02-23 DIAGNOSIS — I341 Nonrheumatic mitral (valve) prolapse: Secondary | ICD-10-CM | POA: Diagnosis not present

## 2019-02-23 DIAGNOSIS — M899 Disorder of bone, unspecified: Secondary | ICD-10-CM | POA: Diagnosis not present

## 2019-02-23 DIAGNOSIS — K219 Gastro-esophageal reflux disease without esophagitis: Secondary | ICD-10-CM | POA: Diagnosis not present

## 2019-02-23 DIAGNOSIS — E7800 Pure hypercholesterolemia, unspecified: Secondary | ICD-10-CM | POA: Diagnosis not present

## 2019-02-23 DIAGNOSIS — M7542 Impingement syndrome of left shoulder: Secondary | ICD-10-CM | POA: Diagnosis not present

## 2019-02-23 DIAGNOSIS — S46012D Strain of muscle(s) and tendon(s) of the rotator cuff of left shoulder, subsequent encounter: Secondary | ICD-10-CM | POA: Diagnosis not present

## 2019-02-28 DIAGNOSIS — M7542 Impingement syndrome of left shoulder: Secondary | ICD-10-CM | POA: Diagnosis not present

## 2019-02-28 DIAGNOSIS — S46012D Strain of muscle(s) and tendon(s) of the rotator cuff of left shoulder, subsequent encounter: Secondary | ICD-10-CM | POA: Diagnosis not present

## 2019-03-02 DIAGNOSIS — S46012D Strain of muscle(s) and tendon(s) of the rotator cuff of left shoulder, subsequent encounter: Secondary | ICD-10-CM | POA: Diagnosis not present

## 2019-03-02 DIAGNOSIS — M7542 Impingement syndrome of left shoulder: Secondary | ICD-10-CM | POA: Diagnosis not present

## 2019-03-03 DIAGNOSIS — Z1211 Encounter for screening for malignant neoplasm of colon: Secondary | ICD-10-CM | POA: Diagnosis not present

## 2019-03-07 DIAGNOSIS — S46012D Strain of muscle(s) and tendon(s) of the rotator cuff of left shoulder, subsequent encounter: Secondary | ICD-10-CM | POA: Diagnosis not present

## 2019-03-07 DIAGNOSIS — M7542 Impingement syndrome of left shoulder: Secondary | ICD-10-CM | POA: Diagnosis not present

## 2019-03-09 DIAGNOSIS — S46012D Strain of muscle(s) and tendon(s) of the rotator cuff of left shoulder, subsequent encounter: Secondary | ICD-10-CM | POA: Diagnosis not present

## 2019-03-09 DIAGNOSIS — M7542 Impingement syndrome of left shoulder: Secondary | ICD-10-CM | POA: Diagnosis not present

## 2019-03-14 DIAGNOSIS — S46012D Strain of muscle(s) and tendon(s) of the rotator cuff of left shoulder, subsequent encounter: Secondary | ICD-10-CM | POA: Diagnosis not present

## 2019-03-14 DIAGNOSIS — M7542 Impingement syndrome of left shoulder: Secondary | ICD-10-CM | POA: Diagnosis not present

## 2019-03-16 DIAGNOSIS — M7542 Impingement syndrome of left shoulder: Secondary | ICD-10-CM | POA: Diagnosis not present

## 2019-03-16 DIAGNOSIS — S46012D Strain of muscle(s) and tendon(s) of the rotator cuff of left shoulder, subsequent encounter: Secondary | ICD-10-CM | POA: Diagnosis not present

## 2019-03-22 DIAGNOSIS — S46012D Strain of muscle(s) and tendon(s) of the rotator cuff of left shoulder, subsequent encounter: Secondary | ICD-10-CM | POA: Diagnosis not present

## 2019-03-22 DIAGNOSIS — M7542 Impingement syndrome of left shoulder: Secondary | ICD-10-CM | POA: Diagnosis not present

## 2019-03-24 DIAGNOSIS — S46012D Strain of muscle(s) and tendon(s) of the rotator cuff of left shoulder, subsequent encounter: Secondary | ICD-10-CM | POA: Diagnosis not present

## 2019-03-24 DIAGNOSIS — M7542 Impingement syndrome of left shoulder: Secondary | ICD-10-CM | POA: Diagnosis not present

## 2019-03-29 DIAGNOSIS — S46012D Strain of muscle(s) and tendon(s) of the rotator cuff of left shoulder, subsequent encounter: Secondary | ICD-10-CM | POA: Diagnosis not present

## 2019-03-29 DIAGNOSIS — M7542 Impingement syndrome of left shoulder: Secondary | ICD-10-CM | POA: Diagnosis not present

## 2019-03-31 DIAGNOSIS — M7542 Impingement syndrome of left shoulder: Secondary | ICD-10-CM | POA: Diagnosis not present

## 2019-03-31 DIAGNOSIS — S46012D Strain of muscle(s) and tendon(s) of the rotator cuff of left shoulder, subsequent encounter: Secondary | ICD-10-CM | POA: Diagnosis not present

## 2019-04-05 DIAGNOSIS — M7542 Impingement syndrome of left shoulder: Secondary | ICD-10-CM | POA: Diagnosis not present

## 2019-04-05 DIAGNOSIS — S46012D Strain of muscle(s) and tendon(s) of the rotator cuff of left shoulder, subsequent encounter: Secondary | ICD-10-CM | POA: Diagnosis not present

## 2019-04-07 DIAGNOSIS — M7542 Impingement syndrome of left shoulder: Secondary | ICD-10-CM | POA: Diagnosis not present

## 2019-04-07 DIAGNOSIS — S46012D Strain of muscle(s) and tendon(s) of the rotator cuff of left shoulder, subsequent encounter: Secondary | ICD-10-CM | POA: Diagnosis not present

## 2019-04-12 DIAGNOSIS — S46012D Strain of muscle(s) and tendon(s) of the rotator cuff of left shoulder, subsequent encounter: Secondary | ICD-10-CM | POA: Diagnosis not present

## 2019-04-12 DIAGNOSIS — M7542 Impingement syndrome of left shoulder: Secondary | ICD-10-CM | POA: Diagnosis not present

## 2019-04-13 ENCOUNTER — Other Ambulatory Visit: Payer: Self-pay | Admitting: Obstetrics and Gynecology

## 2019-04-13 DIAGNOSIS — Z1231 Encounter for screening mammogram for malignant neoplasm of breast: Secondary | ICD-10-CM

## 2019-04-14 DIAGNOSIS — M7542 Impingement syndrome of left shoulder: Secondary | ICD-10-CM | POA: Diagnosis not present

## 2019-04-14 DIAGNOSIS — S46012D Strain of muscle(s) and tendon(s) of the rotator cuff of left shoulder, subsequent encounter: Secondary | ICD-10-CM | POA: Diagnosis not present

## 2019-04-19 DIAGNOSIS — M7542 Impingement syndrome of left shoulder: Secondary | ICD-10-CM | POA: Diagnosis not present

## 2019-04-19 DIAGNOSIS — S46012D Strain of muscle(s) and tendon(s) of the rotator cuff of left shoulder, subsequent encounter: Secondary | ICD-10-CM | POA: Diagnosis not present

## 2019-04-21 DIAGNOSIS — S46012D Strain of muscle(s) and tendon(s) of the rotator cuff of left shoulder, subsequent encounter: Secondary | ICD-10-CM | POA: Diagnosis not present

## 2019-04-21 DIAGNOSIS — M7542 Impingement syndrome of left shoulder: Secondary | ICD-10-CM | POA: Diagnosis not present

## 2019-04-26 DIAGNOSIS — M7542 Impingement syndrome of left shoulder: Secondary | ICD-10-CM | POA: Diagnosis not present

## 2019-04-26 DIAGNOSIS — S46012D Strain of muscle(s) and tendon(s) of the rotator cuff of left shoulder, subsequent encounter: Secondary | ICD-10-CM | POA: Diagnosis not present

## 2019-04-27 ENCOUNTER — Other Ambulatory Visit: Payer: Self-pay

## 2019-04-27 ENCOUNTER — Ambulatory Visit
Admission: RE | Admit: 2019-04-27 | Discharge: 2019-04-27 | Disposition: A | Discharge status: Home / Self Care | Payer: Medicare Other | Source: Ambulatory Visit | Attending: Obstetrics and Gynecology | Admitting: Obstetrics and Gynecology

## 2019-04-27 DIAGNOSIS — Z1231 Encounter for screening mammogram for malignant neoplasm of breast: Secondary | ICD-10-CM

## 2019-04-28 DIAGNOSIS — M7542 Impingement syndrome of left shoulder: Secondary | ICD-10-CM | POA: Diagnosis not present

## 2019-04-28 DIAGNOSIS — S46012D Strain of muscle(s) and tendon(s) of the rotator cuff of left shoulder, subsequent encounter: Secondary | ICD-10-CM | POA: Diagnosis not present

## 2019-05-19 DIAGNOSIS — Z01419 Encounter for gynecological examination (general) (routine) without abnormal findings: Secondary | ICD-10-CM | POA: Diagnosis not present

## 2019-05-19 DIAGNOSIS — M8580 Other specified disorders of bone density and structure, unspecified site: Secondary | ICD-10-CM | POA: Diagnosis not present

## 2019-05-24 ENCOUNTER — Other Ambulatory Visit: Payer: Self-pay | Admitting: Obstetrics and Gynecology

## 2019-05-24 DIAGNOSIS — M8580 Other specified disorders of bone density and structure, unspecified site: Secondary | ICD-10-CM

## 2019-08-31 ENCOUNTER — Other Ambulatory Visit: Payer: Self-pay

## 2019-08-31 ENCOUNTER — Ambulatory Visit
Admission: RE | Admit: 2019-08-31 | Discharge: 2019-08-31 | Disposition: A | Discharge status: Home / Self Care | Payer: Medicare Other | Source: Ambulatory Visit | Attending: Obstetrics and Gynecology | Admitting: Obstetrics and Gynecology

## 2019-08-31 DIAGNOSIS — M8589 Other specified disorders of bone density and structure, multiple sites: Secondary | ICD-10-CM | POA: Diagnosis not present

## 2019-08-31 DIAGNOSIS — Z780 Asymptomatic menopausal state: Secondary | ICD-10-CM | POA: Diagnosis not present

## 2019-08-31 DIAGNOSIS — M8580 Other specified disorders of bone density and structure, unspecified site: Secondary | ICD-10-CM

## 2019-10-05 DIAGNOSIS — D2230 Melanocytic nevi of unspecified part of face: Secondary | ICD-10-CM | POA: Diagnosis not present

## 2019-10-05 DIAGNOSIS — Z85828 Personal history of other malignant neoplasm of skin: Secondary | ICD-10-CM | POA: Diagnosis not present

## 2019-10-05 DIAGNOSIS — L578 Other skin changes due to chronic exposure to nonionizing radiation: Secondary | ICD-10-CM | POA: Diagnosis not present

## 2019-10-05 DIAGNOSIS — D2261 Melanocytic nevi of right upper limb, including shoulder: Secondary | ICD-10-CM | POA: Diagnosis not present

## 2019-10-05 DIAGNOSIS — D1800 Hemangioma unspecified site: Secondary | ICD-10-CM | POA: Diagnosis not present

## 2019-10-05 DIAGNOSIS — L219 Seborrheic dermatitis, unspecified: Secondary | ICD-10-CM | POA: Diagnosis not present

## 2019-10-05 DIAGNOSIS — D2371 Other benign neoplasm of skin of right lower limb, including hip: Secondary | ICD-10-CM | POA: Diagnosis not present

## 2019-10-05 DIAGNOSIS — L570 Actinic keratosis: Secondary | ICD-10-CM | POA: Diagnosis not present

## 2019-10-05 DIAGNOSIS — D485 Neoplasm of uncertain behavior of skin: Secondary | ICD-10-CM | POA: Diagnosis not present

## 2019-10-05 DIAGNOSIS — L820 Inflamed seborrheic keratosis: Secondary | ICD-10-CM | POA: Diagnosis not present

## 2019-10-19 DIAGNOSIS — H0100A Unspecified blepharitis right eye, upper and lower eyelids: Secondary | ICD-10-CM | POA: Diagnosis not present

## 2019-10-19 DIAGNOSIS — H04123 Dry eye syndrome of bilateral lacrimal glands: Secondary | ICD-10-CM | POA: Diagnosis not present

## 2019-10-19 DIAGNOSIS — H18591 Other hereditary corneal dystrophies, right eye: Secondary | ICD-10-CM | POA: Diagnosis not present

## 2019-10-19 DIAGNOSIS — H524 Presbyopia: Secondary | ICD-10-CM | POA: Diagnosis not present

## 2019-11-15 DIAGNOSIS — Z23 Encounter for immunization: Secondary | ICD-10-CM | POA: Diagnosis not present

## 2019-11-29 DIAGNOSIS — Z23 Encounter for immunization: Secondary | ICD-10-CM | POA: Diagnosis not present

## 2019-12-26 DIAGNOSIS — H43812 Vitreous degeneration, left eye: Secondary | ICD-10-CM | POA: Diagnosis not present

## 2020-02-13 DIAGNOSIS — K219 Gastro-esophageal reflux disease without esophagitis: Secondary | ICD-10-CM | POA: Diagnosis not present

## 2020-02-13 DIAGNOSIS — E7800 Pure hypercholesterolemia, unspecified: Secondary | ICD-10-CM | POA: Diagnosis not present

## 2020-02-13 DIAGNOSIS — M810 Age-related osteoporosis without current pathological fracture: Secondary | ICD-10-CM | POA: Diagnosis not present

## 2020-02-13 DIAGNOSIS — E039 Hypothyroidism, unspecified: Secondary | ICD-10-CM | POA: Diagnosis not present

## 2020-03-07 DIAGNOSIS — E039 Hypothyroidism, unspecified: Secondary | ICD-10-CM | POA: Diagnosis not present

## 2020-03-07 DIAGNOSIS — Z0000 Encounter for general adult medical examination without abnormal findings: Secondary | ICD-10-CM | POA: Diagnosis not present

## 2020-03-07 DIAGNOSIS — E7800 Pure hypercholesterolemia, unspecified: Secondary | ICD-10-CM | POA: Diagnosis not present

## 2020-05-17 ENCOUNTER — Other Ambulatory Visit: Payer: Self-pay | Admitting: Obstetrics and Gynecology

## 2020-05-17 DIAGNOSIS — Z1231 Encounter for screening mammogram for malignant neoplasm of breast: Secondary | ICD-10-CM

## 2020-07-12 ENCOUNTER — Ambulatory Visit
Admission: RE | Admit: 2020-07-12 | Discharge: 2020-07-12 | Disposition: A | Discharge status: Home / Self Care | Payer: Medicare Other | Source: Ambulatory Visit | Attending: Obstetrics and Gynecology | Admitting: Obstetrics and Gynecology

## 2020-07-12 ENCOUNTER — Other Ambulatory Visit: Payer: Self-pay

## 2020-07-12 DIAGNOSIS — Z1231 Encounter for screening mammogram for malignant neoplasm of breast: Secondary | ICD-10-CM | POA: Diagnosis not present

## 2020-08-29 DIAGNOSIS — M810 Age-related osteoporosis without current pathological fracture: Secondary | ICD-10-CM | POA: Diagnosis not present

## 2020-08-29 DIAGNOSIS — K219 Gastro-esophageal reflux disease without esophagitis: Secondary | ICD-10-CM | POA: Diagnosis not present

## 2020-08-29 DIAGNOSIS — E039 Hypothyroidism, unspecified: Secondary | ICD-10-CM | POA: Diagnosis not present

## 2020-08-29 DIAGNOSIS — E7800 Pure hypercholesterolemia, unspecified: Secondary | ICD-10-CM | POA: Diagnosis not present

## 2020-10-12 IMAGING — MG DIGITAL SCREENING BILAT W/ TOMO W/ CAD
6 of 10 series · 6 of 30 positions shown · non-contrast
Comparison: Previous exam(s).

CLINICAL DATA: Screening.

EXAM:
DIGITAL SCREENING BILATERAL MAMMOGRAM WITH TOMO AND CAD

[L CC synth-2D]
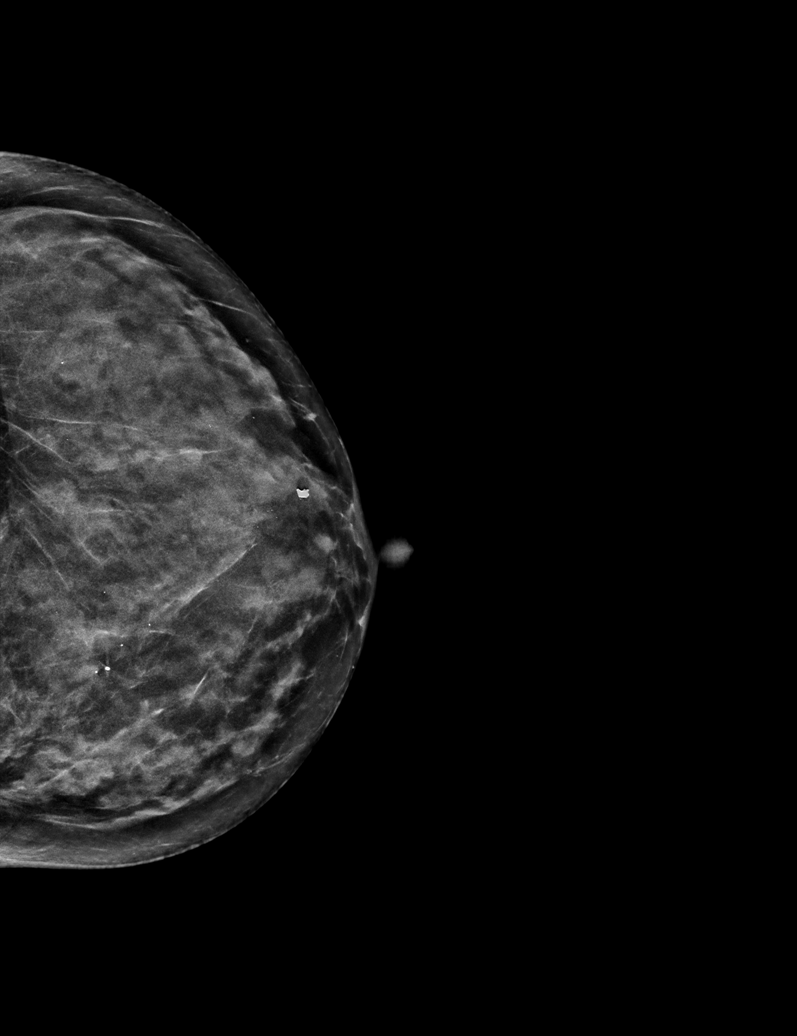

[L MLO synth-2D]
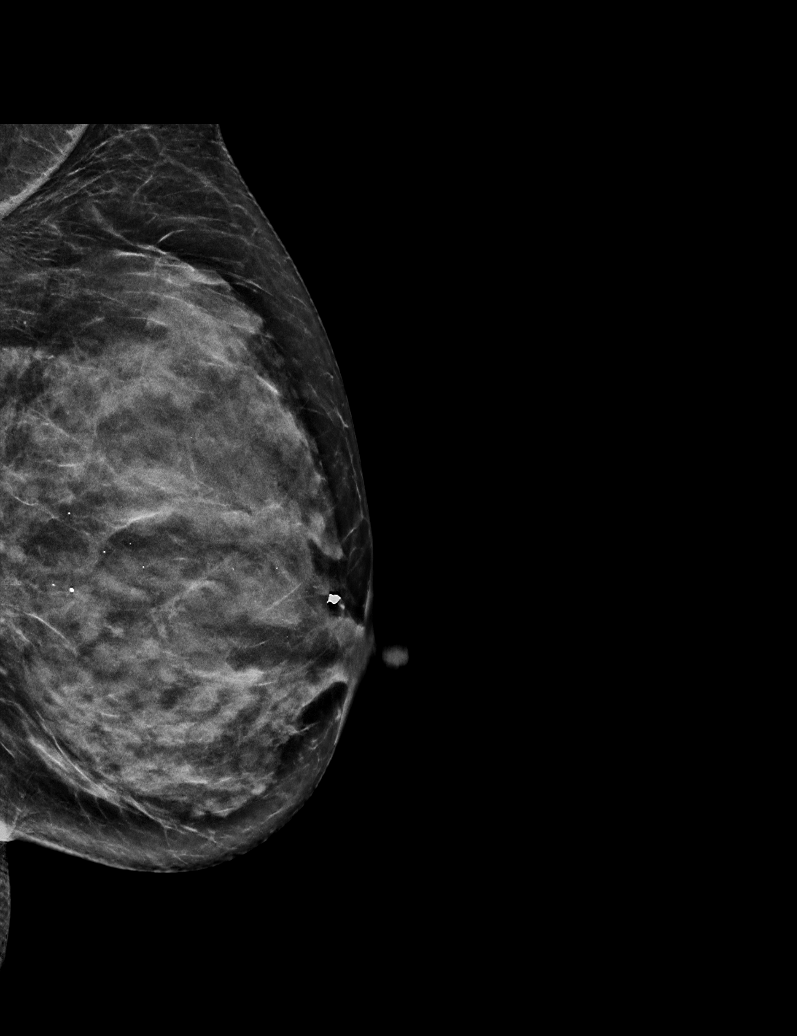

[R MLO synth-2D]
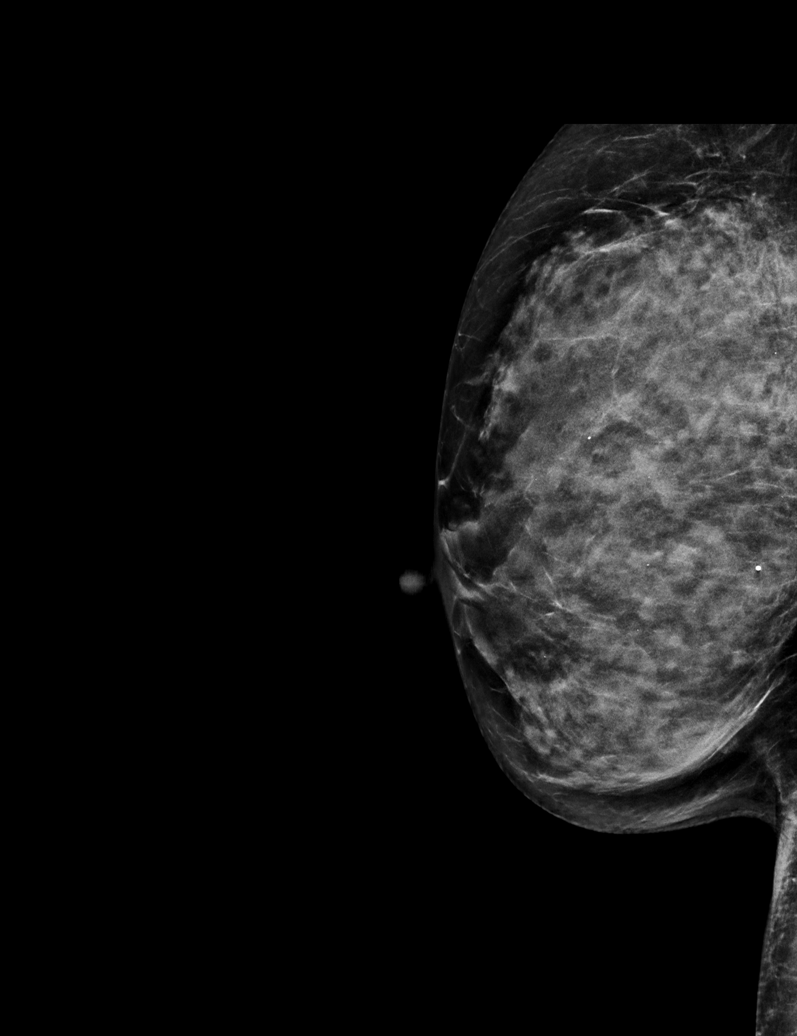

[R CC synth-2D (1 of 2)]
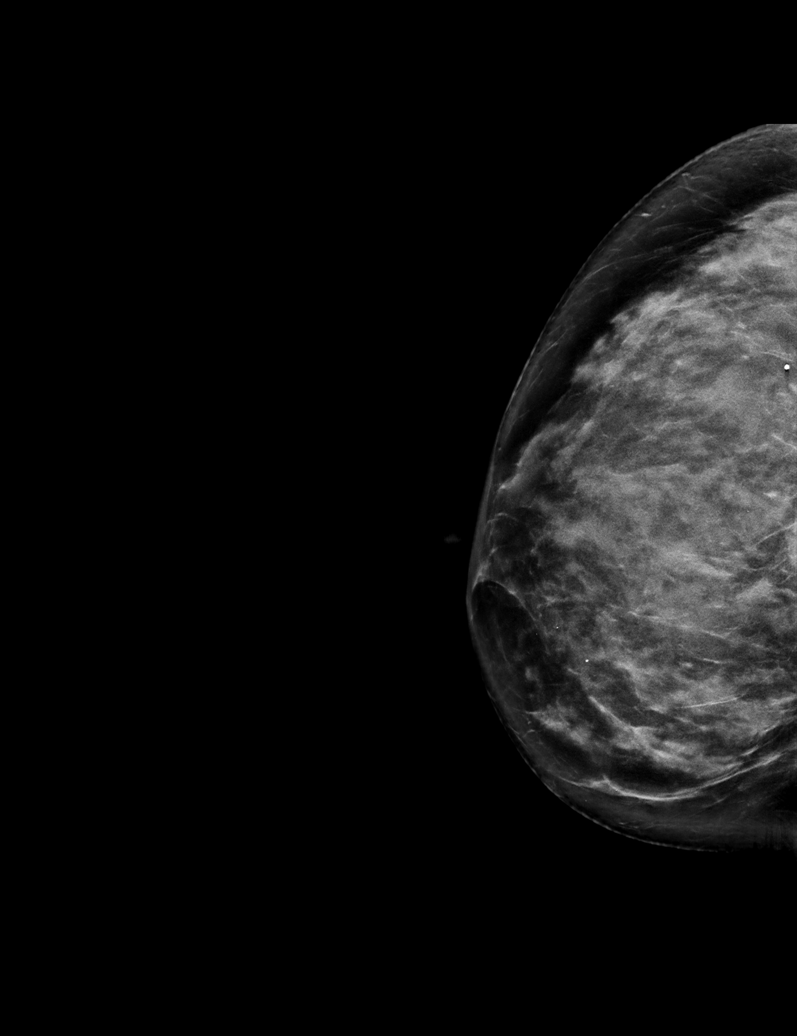

[R CC synth-2D (2 of 2)]
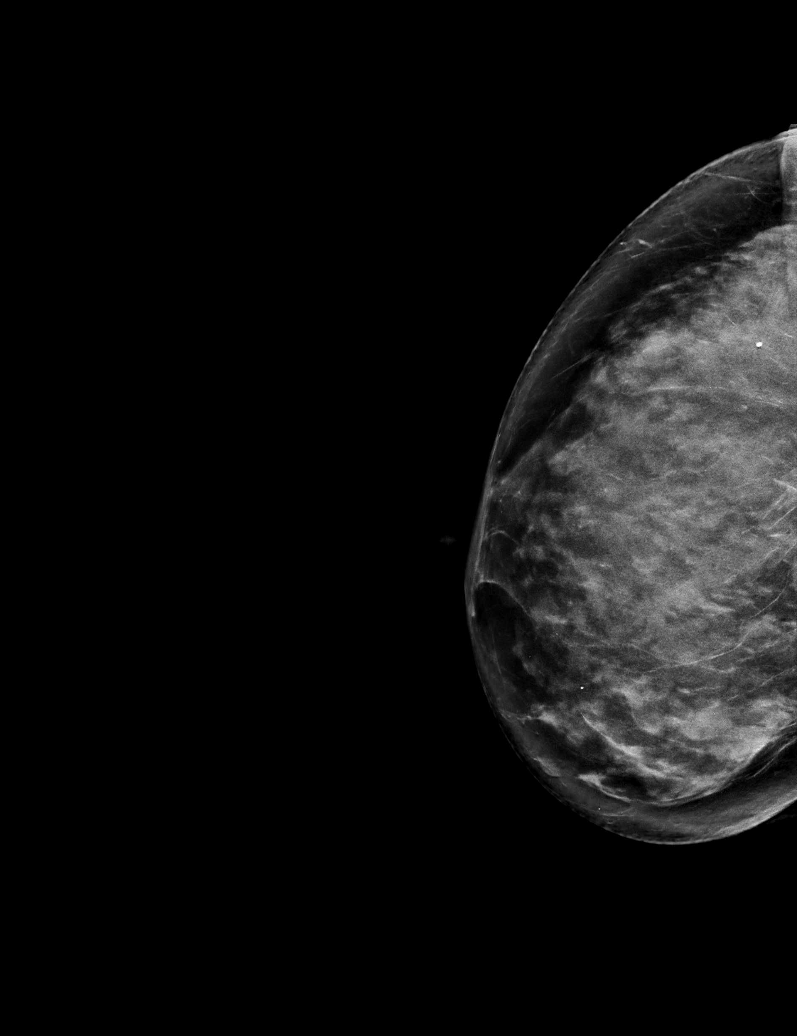

[R CC tomo · tomo slice 31/61.0]
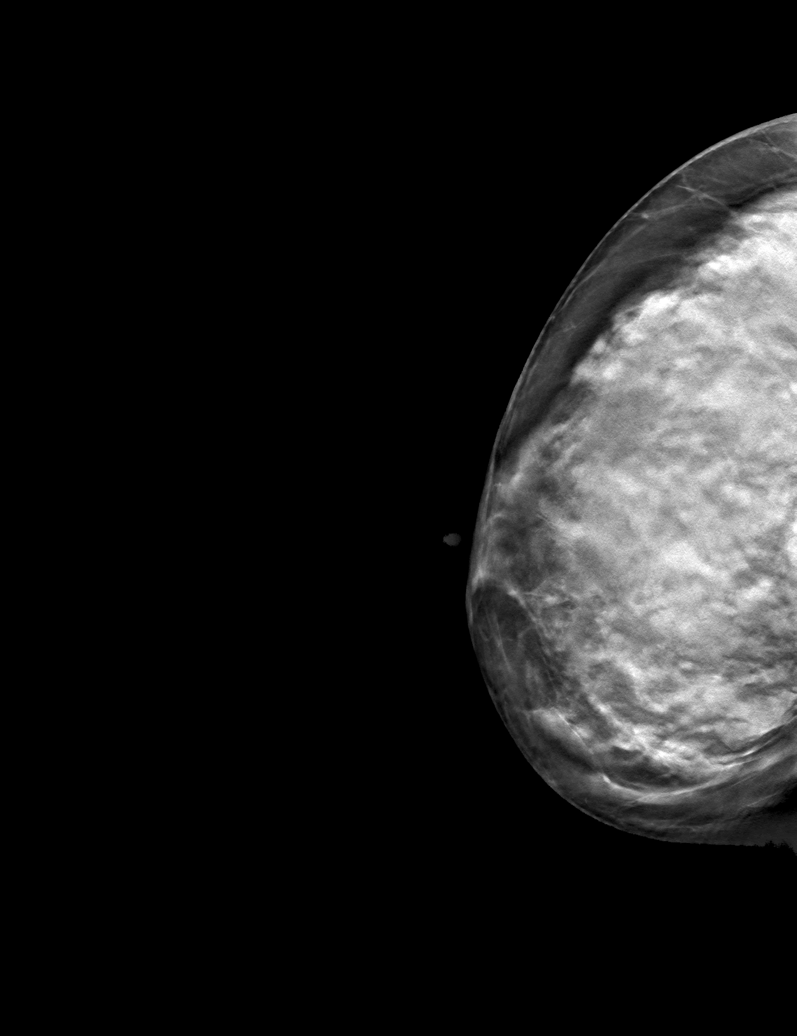

[6 of 30 positions shown; findings below may reference images not displayed]

ACR Breast Density Category d: The breast tissue is extremely dense,
which lowers the sensitivity of mammography
FINDINGS: There are no findings suspicious for malignancy. Images were
processed with CAD.
IMPRESSION: No mammographic evidence of malignancy. A result letter of this
screening mammogram will be mailed directly to the patient.

RECOMMENDATION:
Screening mammogram in one year. (Code:WO-0-ZI0)

BI-RADS CATEGORY  1: Negative.

## 2020-10-23 DIAGNOSIS — H43812 Vitreous degeneration, left eye: Secondary | ICD-10-CM | POA: Diagnosis not present

## 2020-10-23 DIAGNOSIS — H524 Presbyopia: Secondary | ICD-10-CM | POA: Diagnosis not present

## 2020-10-23 DIAGNOSIS — H04123 Dry eye syndrome of bilateral lacrimal glands: Secondary | ICD-10-CM | POA: Diagnosis not present

## 2020-10-23 DIAGNOSIS — H353131 Nonexudative age-related macular degeneration, bilateral, early dry stage: Secondary | ICD-10-CM | POA: Diagnosis not present

## 2020-10-31 DIAGNOSIS — Z23 Encounter for immunization: Secondary | ICD-10-CM | POA: Diagnosis not present

## 2020-12-18 ENCOUNTER — Ambulatory Visit: Payer: Medicare Other | Attending: Internal Medicine

## 2020-12-18 ENCOUNTER — Other Ambulatory Visit (HOSPITAL_BASED_OUTPATIENT_CLINIC_OR_DEPARTMENT_OTHER): Payer: Self-pay

## 2020-12-18 DIAGNOSIS — Z23 Encounter for immunization: Secondary | ICD-10-CM | POA: Diagnosis not present

## 2020-12-18 MED ORDER — PFIZER COVID-19 VAC BIVALENT 30 MCG/0.3ML IM SUSP
INTRAMUSCULAR | 0 refills | Status: AC
  Filled 2020-12-18: qty 0.3, 1d supply, fill #0

## 2020-12-18 NOTE — Progress Notes (Signed)
° °  [facility]  Name:  Whitney Owen    MRN: [id number] DOB: 08-20-1940  12/18/2020  Whitney Owen was observed post Covid-19 immunization for 15 minutes without incident. She was provided with Vaccine Information Sheet and instruction to access the V-Safe system.   Whitney Owen was instructed to call 911 with any severe reactions post vaccine: Difficulty breathing  Swelling of face and throat  A fast heartbeat  A bad rash all over body  Dizziness and weakness   Immunizations Administered     Name Date Dose VIS Date Route   Pfizer Covid-19 Vaccine Bivalent Booster 12/18/2020  9:49 AM 0.3 mL 09/05/2020 Intramuscular   Manufacturer: [place]   Lot: [id number]   [place]: [phone]

## 2021-01-18 DIAGNOSIS — D2230 Melanocytic nevi of unspecified part of face: Secondary | ICD-10-CM | POA: Diagnosis not present

## 2021-01-18 DIAGNOSIS — L821 Other seborrheic keratosis: Secondary | ICD-10-CM | POA: Diagnosis not present

## 2021-01-18 DIAGNOSIS — D2261 Melanocytic nevi of right upper limb, including shoulder: Secondary | ICD-10-CM | POA: Diagnosis not present

## 2021-01-18 DIAGNOSIS — Q828 Other specified congenital malformations of skin: Secondary | ICD-10-CM | POA: Diagnosis not present

## 2021-01-18 DIAGNOSIS — D2371 Other benign neoplasm of skin of right lower limb, including hip: Secondary | ICD-10-CM | POA: Diagnosis not present

## 2021-01-18 DIAGNOSIS — Z85828 Personal history of other malignant neoplasm of skin: Secondary | ICD-10-CM | POA: Diagnosis not present

## 2021-01-18 DIAGNOSIS — L219 Seborrheic dermatitis, unspecified: Secondary | ICD-10-CM | POA: Diagnosis not present

## 2021-01-18 DIAGNOSIS — L578 Other skin changes due to chronic exposure to nonionizing radiation: Secondary | ICD-10-CM | POA: Diagnosis not present

## 2021-01-18 DIAGNOSIS — D1800 Hemangioma unspecified site: Secondary | ICD-10-CM | POA: Diagnosis not present

## 2021-01-18 DIAGNOSIS — L720 Epidermal cyst: Secondary | ICD-10-CM | POA: Diagnosis not present

## 2021-03-27 DIAGNOSIS — I10 Essential (primary) hypertension: Secondary | ICD-10-CM | POA: Diagnosis not present

## 2021-03-27 DIAGNOSIS — E7800 Pure hypercholesterolemia, unspecified: Secondary | ICD-10-CM | POA: Diagnosis not present

## 2021-03-27 DIAGNOSIS — M5431 Sciatica, right side: Secondary | ICD-10-CM | POA: Diagnosis not present

## 2021-03-27 DIAGNOSIS — Z23 Encounter for immunization: Secondary | ICD-10-CM | POA: Diagnosis not present

## 2021-03-27 DIAGNOSIS — M810 Age-related osteoporosis without current pathological fracture: Secondary | ICD-10-CM | POA: Diagnosis not present

## 2021-03-27 DIAGNOSIS — E039 Hypothyroidism, unspecified: Secondary | ICD-10-CM | POA: Diagnosis not present

## 2021-03-27 DIAGNOSIS — Z0000 Encounter for general adult medical examination without abnormal findings: Secondary | ICD-10-CM | POA: Diagnosis not present

## 2021-06-18 ENCOUNTER — Other Ambulatory Visit: Payer: Self-pay | Admitting: Obstetrics and Gynecology

## 2021-06-18 DIAGNOSIS — Z1231 Encounter for screening mammogram for malignant neoplasm of breast: Secondary | ICD-10-CM

## 2021-07-16 ENCOUNTER — Ambulatory Visit
Admission: RE | Admit: 2021-07-16 | Discharge: 2021-07-16 | Disposition: A | Discharge status: Home / Self Care | Payer: Medicare Other | Source: Ambulatory Visit | Attending: Obstetrics and Gynecology | Admitting: Obstetrics and Gynecology

## 2021-07-16 DIAGNOSIS — Z1231 Encounter for screening mammogram for malignant neoplasm of breast: Secondary | ICD-10-CM | POA: Diagnosis not present

## 2021-08-05 DIAGNOSIS — Z01419 Encounter for gynecological examination (general) (routine) without abnormal findings: Secondary | ICD-10-CM | POA: Diagnosis not present

## 2021-08-05 DIAGNOSIS — I10 Essential (primary) hypertension: Secondary | ICD-10-CM | POA: Diagnosis not present

## 2021-08-05 DIAGNOSIS — M8580 Other specified disorders of bone density and structure, unspecified site: Secondary | ICD-10-CM | POA: Diagnosis not present

## 2021-08-09 ENCOUNTER — Other Ambulatory Visit: Payer: Self-pay | Admitting: Nurse Practitioner

## 2021-08-09 DIAGNOSIS — I10 Essential (primary) hypertension: Secondary | ICD-10-CM | POA: Diagnosis not present

## 2021-08-09 DIAGNOSIS — M8580 Other specified disorders of bone density and structure, unspecified site: Secondary | ICD-10-CM

## 2021-08-14 DIAGNOSIS — M8589 Other specified disorders of bone density and structure, multiple sites: Secondary | ICD-10-CM | POA: Diagnosis not present

## 2021-08-14 DIAGNOSIS — Z780 Asymptomatic menopausal state: Secondary | ICD-10-CM | POA: Diagnosis not present

## 2021-10-30 DIAGNOSIS — D3132 Benign neoplasm of left choroid: Secondary | ICD-10-CM | POA: Diagnosis not present

## 2021-10-30 DIAGNOSIS — H353132 Nonexudative age-related macular degeneration, bilateral, intermediate dry stage: Secondary | ICD-10-CM | POA: Diagnosis not present

## 2021-10-30 DIAGNOSIS — H04123 Dry eye syndrome of bilateral lacrimal glands: Secondary | ICD-10-CM | POA: Diagnosis not present

## 2021-10-30 DIAGNOSIS — H524 Presbyopia: Secondary | ICD-10-CM | POA: Diagnosis not present

## 2021-10-30 DIAGNOSIS — H43812 Vitreous degeneration, left eye: Secondary | ICD-10-CM | POA: Diagnosis not present

## 2021-10-30 DIAGNOSIS — H52203 Unspecified astigmatism, bilateral: Secondary | ICD-10-CM | POA: Diagnosis not present

## 2021-11-16 DIAGNOSIS — Z23 Encounter for immunization: Secondary | ICD-10-CM | POA: Diagnosis not present

## 2022-01-22 DIAGNOSIS — L821 Other seborrheic keratosis: Secondary | ICD-10-CM | POA: Diagnosis not present

## 2022-01-22 DIAGNOSIS — L570 Actinic keratosis: Secondary | ICD-10-CM | POA: Diagnosis not present

## 2022-01-22 DIAGNOSIS — L720 Epidermal cyst: Secondary | ICD-10-CM | POA: Diagnosis not present

## 2022-01-22 DIAGNOSIS — D2371 Other benign neoplasm of skin of right lower limb, including hip: Secondary | ICD-10-CM | POA: Diagnosis not present

## 2022-01-22 DIAGNOSIS — D485 Neoplasm of uncertain behavior of skin: Secondary | ICD-10-CM | POA: Diagnosis not present

## 2022-01-22 DIAGNOSIS — L219 Seborrheic dermatitis, unspecified: Secondary | ICD-10-CM | POA: Diagnosis not present

## 2022-01-22 DIAGNOSIS — D1800 Hemangioma unspecified site: Secondary | ICD-10-CM | POA: Diagnosis not present

## 2022-01-22 DIAGNOSIS — D2261 Melanocytic nevi of right upper limb, including shoulder: Secondary | ICD-10-CM | POA: Diagnosis not present

## 2022-01-22 DIAGNOSIS — H61002 Unspecified perichondritis of left external ear: Secondary | ICD-10-CM | POA: Diagnosis not present

## 2022-01-22 DIAGNOSIS — Z85828 Personal history of other malignant neoplasm of skin: Secondary | ICD-10-CM | POA: Diagnosis not present

## 2022-01-22 DIAGNOSIS — D2230 Melanocytic nevi of unspecified part of face: Secondary | ICD-10-CM | POA: Diagnosis not present

## 2022-01-22 DIAGNOSIS — L578 Other skin changes due to chronic exposure to nonionizing radiation: Secondary | ICD-10-CM | POA: Diagnosis not present

## 2022-01-27 ENCOUNTER — Other Ambulatory Visit: Payer: Medicare Other

## 2022-04-11 DIAGNOSIS — L570 Actinic keratosis: Secondary | ICD-10-CM | POA: Diagnosis not present

## 2022-04-11 DIAGNOSIS — L821 Other seborrheic keratosis: Secondary | ICD-10-CM | POA: Diagnosis not present

## 2022-04-21 DIAGNOSIS — I10 Essential (primary) hypertension: Secondary | ICD-10-CM | POA: Diagnosis not present

## 2022-04-21 DIAGNOSIS — E039 Hypothyroidism, unspecified: Secondary | ICD-10-CM | POA: Diagnosis not present

## 2022-04-21 DIAGNOSIS — M810 Age-related osteoporosis without current pathological fracture: Secondary | ICD-10-CM | POA: Diagnosis not present

## 2022-04-21 DIAGNOSIS — Z0000 Encounter for general adult medical examination without abnormal findings: Secondary | ICD-10-CM | POA: Diagnosis not present

## 2022-04-21 DIAGNOSIS — E7800 Pure hypercholesterolemia, unspecified: Secondary | ICD-10-CM | POA: Diagnosis not present

## 2022-07-30 ENCOUNTER — Other Ambulatory Visit: Payer: Self-pay | Admitting: Obstetrics and Gynecology

## 2022-07-30 DIAGNOSIS — Z1231 Encounter for screening mammogram for malignant neoplasm of breast: Secondary | ICD-10-CM

## 2022-08-14 ENCOUNTER — Ambulatory Visit: Payer: Medicare Other

## 2022-08-27 ENCOUNTER — Ambulatory Visit
Admission: RE | Admit: 2022-08-27 | Discharge: 2022-08-27 | Disposition: A | Discharge status: Home / Self Care | Payer: Medicare Other | Source: Ambulatory Visit | Attending: Obstetrics and Gynecology | Admitting: Obstetrics and Gynecology

## 2022-08-27 DIAGNOSIS — Z1231 Encounter for screening mammogram for malignant neoplasm of breast: Secondary | ICD-10-CM | POA: Diagnosis not present

## 2022-10-28 DIAGNOSIS — Z23 Encounter for immunization: Secondary | ICD-10-CM | POA: Diagnosis not present

## 2022-11-12 DIAGNOSIS — D3132 Benign neoplasm of left choroid: Secondary | ICD-10-CM | POA: Diagnosis not present

## 2022-11-12 DIAGNOSIS — H52203 Unspecified astigmatism, bilateral: Secondary | ICD-10-CM | POA: Diagnosis not present

## 2022-11-12 DIAGNOSIS — H04123 Dry eye syndrome of bilateral lacrimal glands: Secondary | ICD-10-CM | POA: Diagnosis not present

## 2022-11-12 DIAGNOSIS — H43812 Vitreous degeneration, left eye: Secondary | ICD-10-CM | POA: Diagnosis not present

## 2022-11-12 DIAGNOSIS — H353132 Nonexudative age-related macular degeneration, bilateral, intermediate dry stage: Secondary | ICD-10-CM | POA: Diagnosis not present

## 2022-11-18 DIAGNOSIS — Z23 Encounter for immunization: Secondary | ICD-10-CM | POA: Diagnosis not present

## 2022-12-24 DIAGNOSIS — R0981 Nasal congestion: Secondary | ICD-10-CM | POA: Diagnosis not present

## 2022-12-24 DIAGNOSIS — U071 COVID-19: Secondary | ICD-10-CM | POA: Diagnosis not present

## 2023-01-30 DIAGNOSIS — L821 Other seborrheic keratosis: Secondary | ICD-10-CM | POA: Diagnosis not present

## 2023-01-30 DIAGNOSIS — D1800 Hemangioma unspecified site: Secondary | ICD-10-CM | POA: Diagnosis not present

## 2023-01-30 DIAGNOSIS — L219 Seborrheic dermatitis, unspecified: Secondary | ICD-10-CM | POA: Diagnosis not present

## 2023-01-30 DIAGNOSIS — D2230 Melanocytic nevi of unspecified part of face: Secondary | ICD-10-CM | POA: Diagnosis not present

## 2023-01-30 DIAGNOSIS — D2371 Other benign neoplasm of skin of right lower limb, including hip: Secondary | ICD-10-CM | POA: Diagnosis not present

## 2023-01-30 DIAGNOSIS — L720 Epidermal cyst: Secondary | ICD-10-CM | POA: Diagnosis not present

## 2023-01-30 DIAGNOSIS — D2261 Melanocytic nevi of right upper limb, including shoulder: Secondary | ICD-10-CM | POA: Diagnosis not present

## 2023-01-30 DIAGNOSIS — Z85828 Personal history of other malignant neoplasm of skin: Secondary | ICD-10-CM | POA: Diagnosis not present

## 2023-01-30 DIAGNOSIS — L578 Other skin changes due to chronic exposure to nonionizing radiation: Secondary | ICD-10-CM | POA: Diagnosis not present

## 2023-05-04 DIAGNOSIS — Z0000 Encounter for general adult medical examination without abnormal findings: Secondary | ICD-10-CM | POA: Diagnosis not present

## 2023-05-04 DIAGNOSIS — E039 Hypothyroidism, unspecified: Secondary | ICD-10-CM | POA: Diagnosis not present

## 2023-05-04 DIAGNOSIS — E7800 Pure hypercholesterolemia, unspecified: Secondary | ICD-10-CM | POA: Diagnosis not present

## 2023-05-04 DIAGNOSIS — M85852 Other specified disorders of bone density and structure, left thigh: Secondary | ICD-10-CM | POA: Diagnosis not present

## 2023-05-04 DIAGNOSIS — I10 Essential (primary) hypertension: Secondary | ICD-10-CM | POA: Diagnosis not present

## 2023-05-12 ENCOUNTER — Other Ambulatory Visit: Payer: Self-pay | Admitting: Family Medicine

## 2023-05-12 DIAGNOSIS — M85852 Other specified disorders of bone density and structure, left thigh: Secondary | ICD-10-CM

## 2023-05-27 ENCOUNTER — Other Ambulatory Visit: Payer: Self-pay | Admitting: Obstetrics and Gynecology

## 2023-05-27 DIAGNOSIS — Z1231 Encounter for screening mammogram for malignant neoplasm of breast: Secondary | ICD-10-CM

## 2023-08-24 ENCOUNTER — Other Ambulatory Visit (HOSPITAL_BASED_OUTPATIENT_CLINIC_OR_DEPARTMENT_OTHER)

## 2023-09-01 ENCOUNTER — Ambulatory Visit
Admission: RE | Admit: 2023-09-01 | Discharge: 2023-09-01 | Disposition: A | Discharge status: Home / Self Care | Source: Ambulatory Visit | Attending: Obstetrics and Gynecology | Admitting: Obstetrics and Gynecology

## 2023-09-01 DIAGNOSIS — Z1231 Encounter for screening mammogram for malignant neoplasm of breast: Secondary | ICD-10-CM

## 2023-09-09 DIAGNOSIS — I10 Essential (primary) hypertension: Secondary | ICD-10-CM | POA: Diagnosis not present

## 2023-09-09 DIAGNOSIS — Z01419 Encounter for gynecological examination (general) (routine) without abnormal findings: Secondary | ICD-10-CM | POA: Diagnosis not present

## 2023-10-22 ENCOUNTER — Other Ambulatory Visit: Payer: Self-pay

## 2023-11-02 DIAGNOSIS — Z23 Encounter for immunization: Secondary | ICD-10-CM | POA: Diagnosis not present

## 2023-11-18 DIAGNOSIS — H18591 Other hereditary corneal dystrophies, right eye: Secondary | ICD-10-CM | POA: Diagnosis not present

## 2023-11-18 DIAGNOSIS — H52203 Unspecified astigmatism, bilateral: Secondary | ICD-10-CM | POA: Diagnosis not present

## 2023-11-18 DIAGNOSIS — H43813 Vitreous degeneration, bilateral: Secondary | ICD-10-CM | POA: Diagnosis not present

## 2023-11-18 DIAGNOSIS — H04123 Dry eye syndrome of bilateral lacrimal glands: Secondary | ICD-10-CM | POA: Diagnosis not present

## 2023-11-18 DIAGNOSIS — D3132 Benign neoplasm of left choroid: Secondary | ICD-10-CM | POA: Diagnosis not present

## 2023-11-18 DIAGNOSIS — H353131 Nonexudative age-related macular degeneration, bilateral, early dry stage: Secondary | ICD-10-CM | POA: Diagnosis not present

## 2023-11-18 DIAGNOSIS — H524 Presbyopia: Secondary | ICD-10-CM | POA: Diagnosis not present

## 2023-12-23 ENCOUNTER — Other Ambulatory Visit (HOSPITAL_BASED_OUTPATIENT_CLINIC_OR_DEPARTMENT_OTHER): Payer: Self-pay

## 2023-12-23 MED ORDER — COMIRNATY 30 MCG/0.3ML IM SUSY
0.3000 mL | PREFILLED_SYRINGE | Freq: Once | INTRAMUSCULAR | 0 refills | Status: AC
  Filled 2023-12-23: qty 0.3, 1d supply, fill #0

## 2024-03-17 ENCOUNTER — Other Ambulatory Visit (HOSPITAL_BASED_OUTPATIENT_CLINIC_OR_DEPARTMENT_OTHER)
